# Patient Record
Sex: Male | Born: 1975 | Race: White | Hispanic: No | Marital: Married | Smoking: Current every day smoker
Health system: Southern US, Community
[De-identification: ages and names within clinical notes are randomized; demographics above are authoritative.]

## PROBLEM LIST (undated history)

## (undated) DIAGNOSIS — D6859 Other primary thrombophilia: Secondary | ICD-10-CM

## (undated) HISTORY — PX: IVC FILTER PLACEMENT (ARMC HX): HXRAD1551

---

## 2009-09-05 ENCOUNTER — Inpatient Hospital Stay (HOSPITAL_COMMUNITY): Admission: EM | Admit: 2009-09-05 | Discharge: 2009-09-06 | Payer: Self-pay | Admitting: Emergency Medicine

## 2009-09-05 ENCOUNTER — Ambulatory Visit: Payer: Self-pay | Admitting: Emergency Medicine

## 2009-11-06 ENCOUNTER — Encounter: Admission: RE | Admit: 2009-11-06 | Discharge: 2009-11-06 | Payer: Self-pay | Admitting: Orthopedic Surgery

## 2009-11-07 ENCOUNTER — Ambulatory Visit (HOSPITAL_BASED_OUTPATIENT_CLINIC_OR_DEPARTMENT_OTHER): Admission: RE | Admit: 2009-11-07 | Discharge: 2009-11-08 | Payer: Self-pay | Admitting: Orthopedic Surgery

## 2010-04-11 LAB — POCT HEMOGLOBIN-HEMACUE: Hemoglobin: 16.7 g/dL (ref 13.0–17.0)

## 2010-04-11 LAB — APTT: aPTT: 30 seconds (ref 24–37)

## 2010-04-12 LAB — DIFFERENTIAL
Basophils Absolute: 0 10*3/uL (ref 0.0–0.1)
Eosinophils Relative: 1 % (ref 0–5)
Lymphocytes Relative: 22 % (ref 12–46)
Lymphs Abs: 2 10*3/uL (ref 0.7–4.0)
Monocytes Absolute: 0.3 10*3/uL (ref 0.1–1.0)
Monocytes Relative: 3 % (ref 3–12)
Neutro Abs: 6.8 10*3/uL (ref 1.7–7.7)

## 2010-04-12 LAB — CBC
HCT: 40.6 % (ref 39.0–52.0)
HCT: 41.9 % (ref 39.0–52.0)
Hemoglobin: 14.1 g/dL (ref 13.0–17.0)
MCHC: 33.5 g/dL (ref 30.0–36.0)
MCV: 89.5 fL (ref 78.0–100.0)
MCV: 89.8 fL (ref 78.0–100.0)
Platelets: 210 10*3/uL (ref 150–400)
RBC: 4.68 MIL/uL (ref 4.22–5.81)
RDW: 13.7 % (ref 11.5–15.5)
RDW: 13.7 % (ref 11.5–15.5)
WBC: 9.2 10*3/uL (ref 4.0–10.5)

## 2010-04-12 LAB — POCT I-STAT 3, ART BLOOD GAS (G3+)
Acid-Base Excess: 1 mmol/L (ref 0.0–2.0)
Bicarbonate: 26.4 mEq/L — ABNORMAL HIGH (ref 20.0–24.0)
TCO2: 28 mmol/L (ref 0–100)
pO2, Arterial: 207 mmHg — ABNORMAL HIGH (ref 80.0–100.0)

## 2010-04-12 LAB — BASIC METABOLIC PANEL
Chloride: 106 mEq/L (ref 96–112)
GFR calc Af Amer: >60 mL/min (ref >60)
GFR calc non Af Amer: >60 mL/min (ref >60)
Potassium: 4.5 mEq/L (ref 3.5–5.1)
Sodium: 136 mEq/L (ref 135–145)

## 2010-04-12 LAB — COMPREHENSIVE METABOLIC PANEL
BUN: 7 mg/dL (ref 6–23)
Calcium: 8.5 mg/dL (ref 8.4–10.5)
Glucose, Bld: 130 mg/dL — ABNORMAL HIGH (ref 70–99)
Total Protein: 6.2 g/dL (ref 6.0–8.3)

## 2010-04-12 LAB — GLUCOSE, CAPILLARY
Glucose-Capillary: 112 mg/dL — ABNORMAL HIGH (ref 70–99)
Glucose-Capillary: 128 mg/dL — ABNORMAL HIGH (ref 70–99)
Glucose-Capillary: 129 mg/dL — ABNORMAL HIGH (ref 70–99)

## 2010-04-12 LAB — PROTIME-INR
INR: 1 (ref 0.00–1.49)
INR: 1.02 (ref 0.00–1.49)
Prothrombin Time: 13.4 seconds (ref 11.6–15.2)
Prothrombin Time: 13.6 seconds (ref 11.6–15.2)

## 2010-04-12 LAB — PROCALCITONIN: Procalcitonin: <0.5 ng/mL

## 2010-04-12 LAB — MRSA PCR SCREENING: MRSA by PCR: NEGATIVE

## 2011-08-28 IMAGING — CR DG CHEST 2V
2 series · 2 of 2 positions shown · non-contrast
Comparison: 09/05/2009

CLINICAL DATA: Pneumonia.

CHEST - 2 VIEW

[w chest pa]
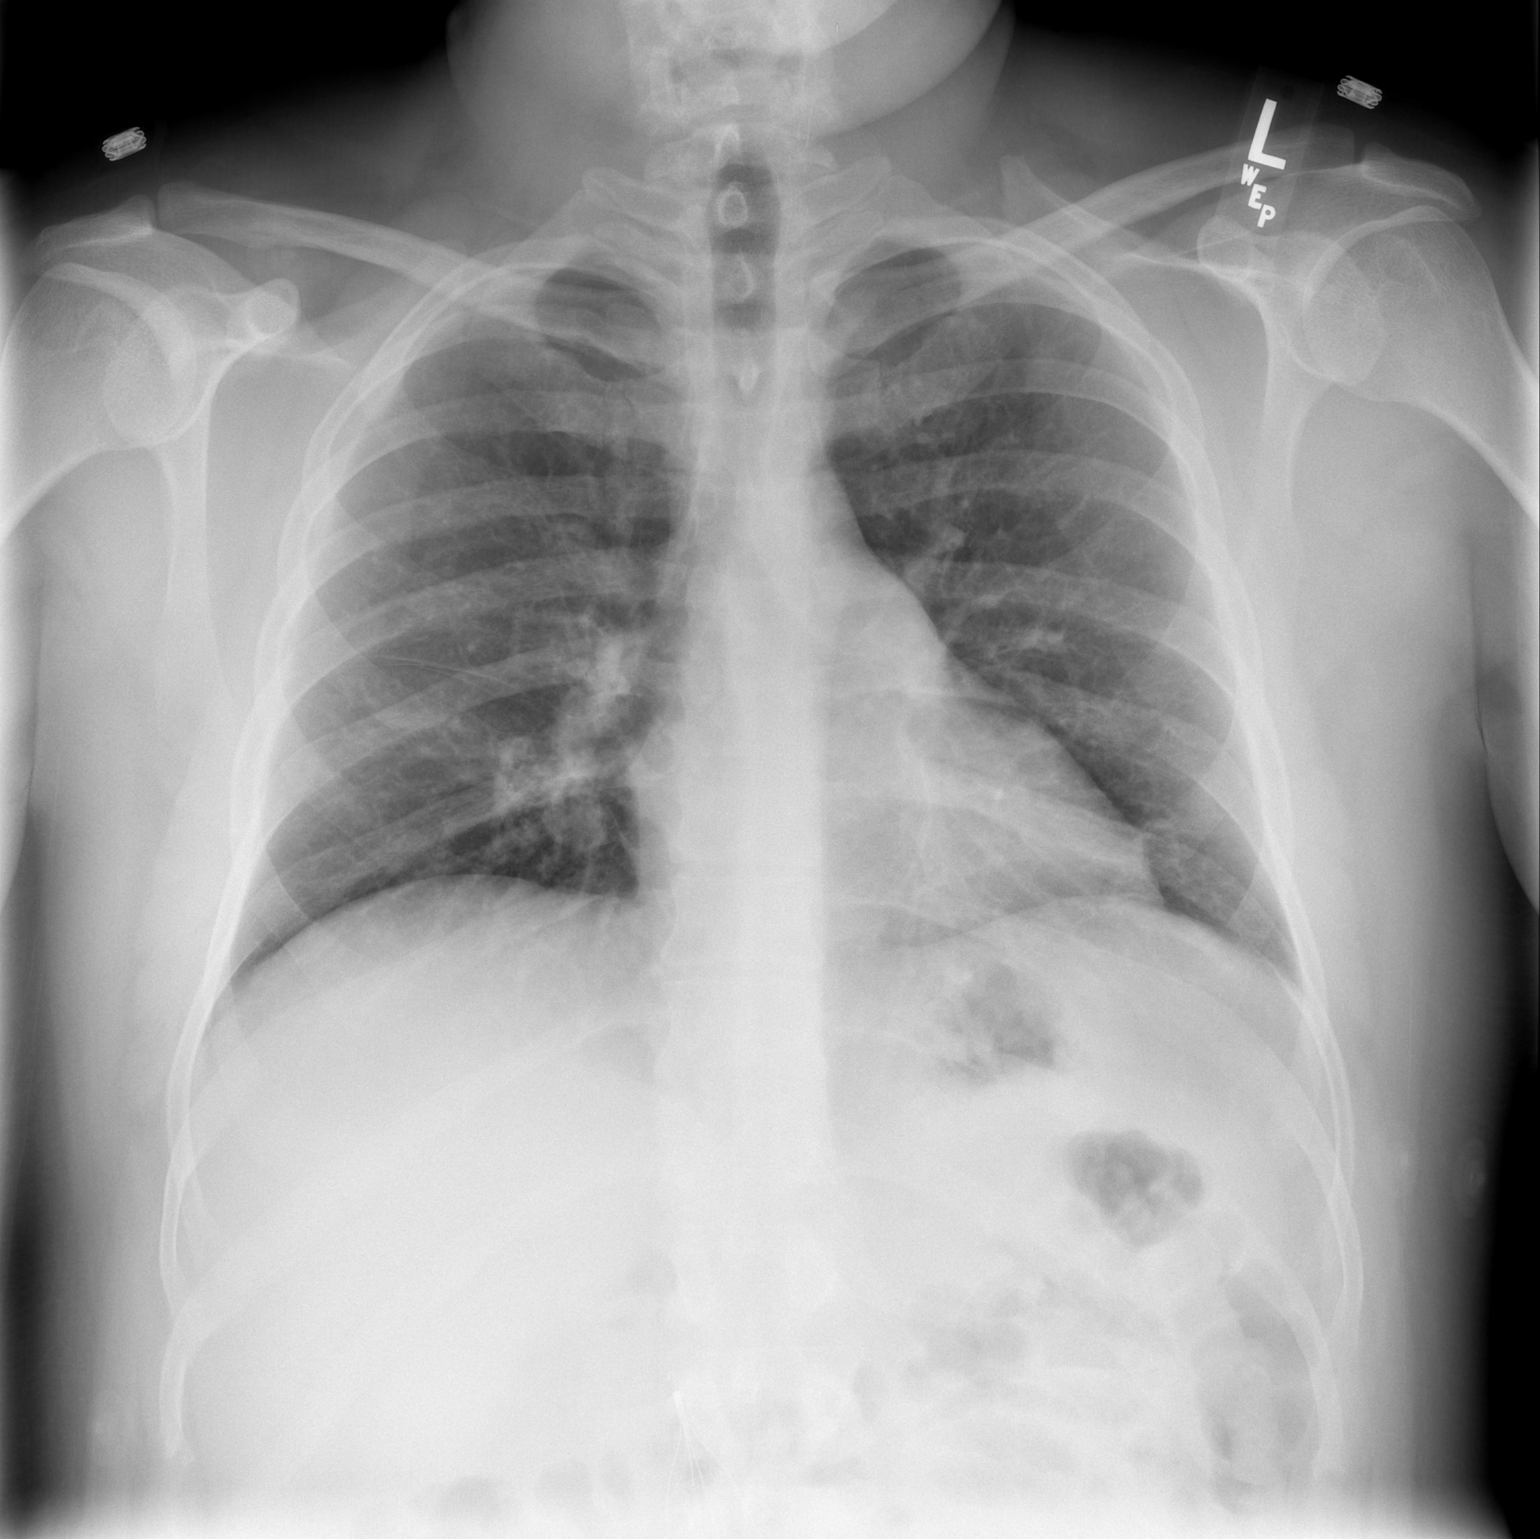

[w chest lat]
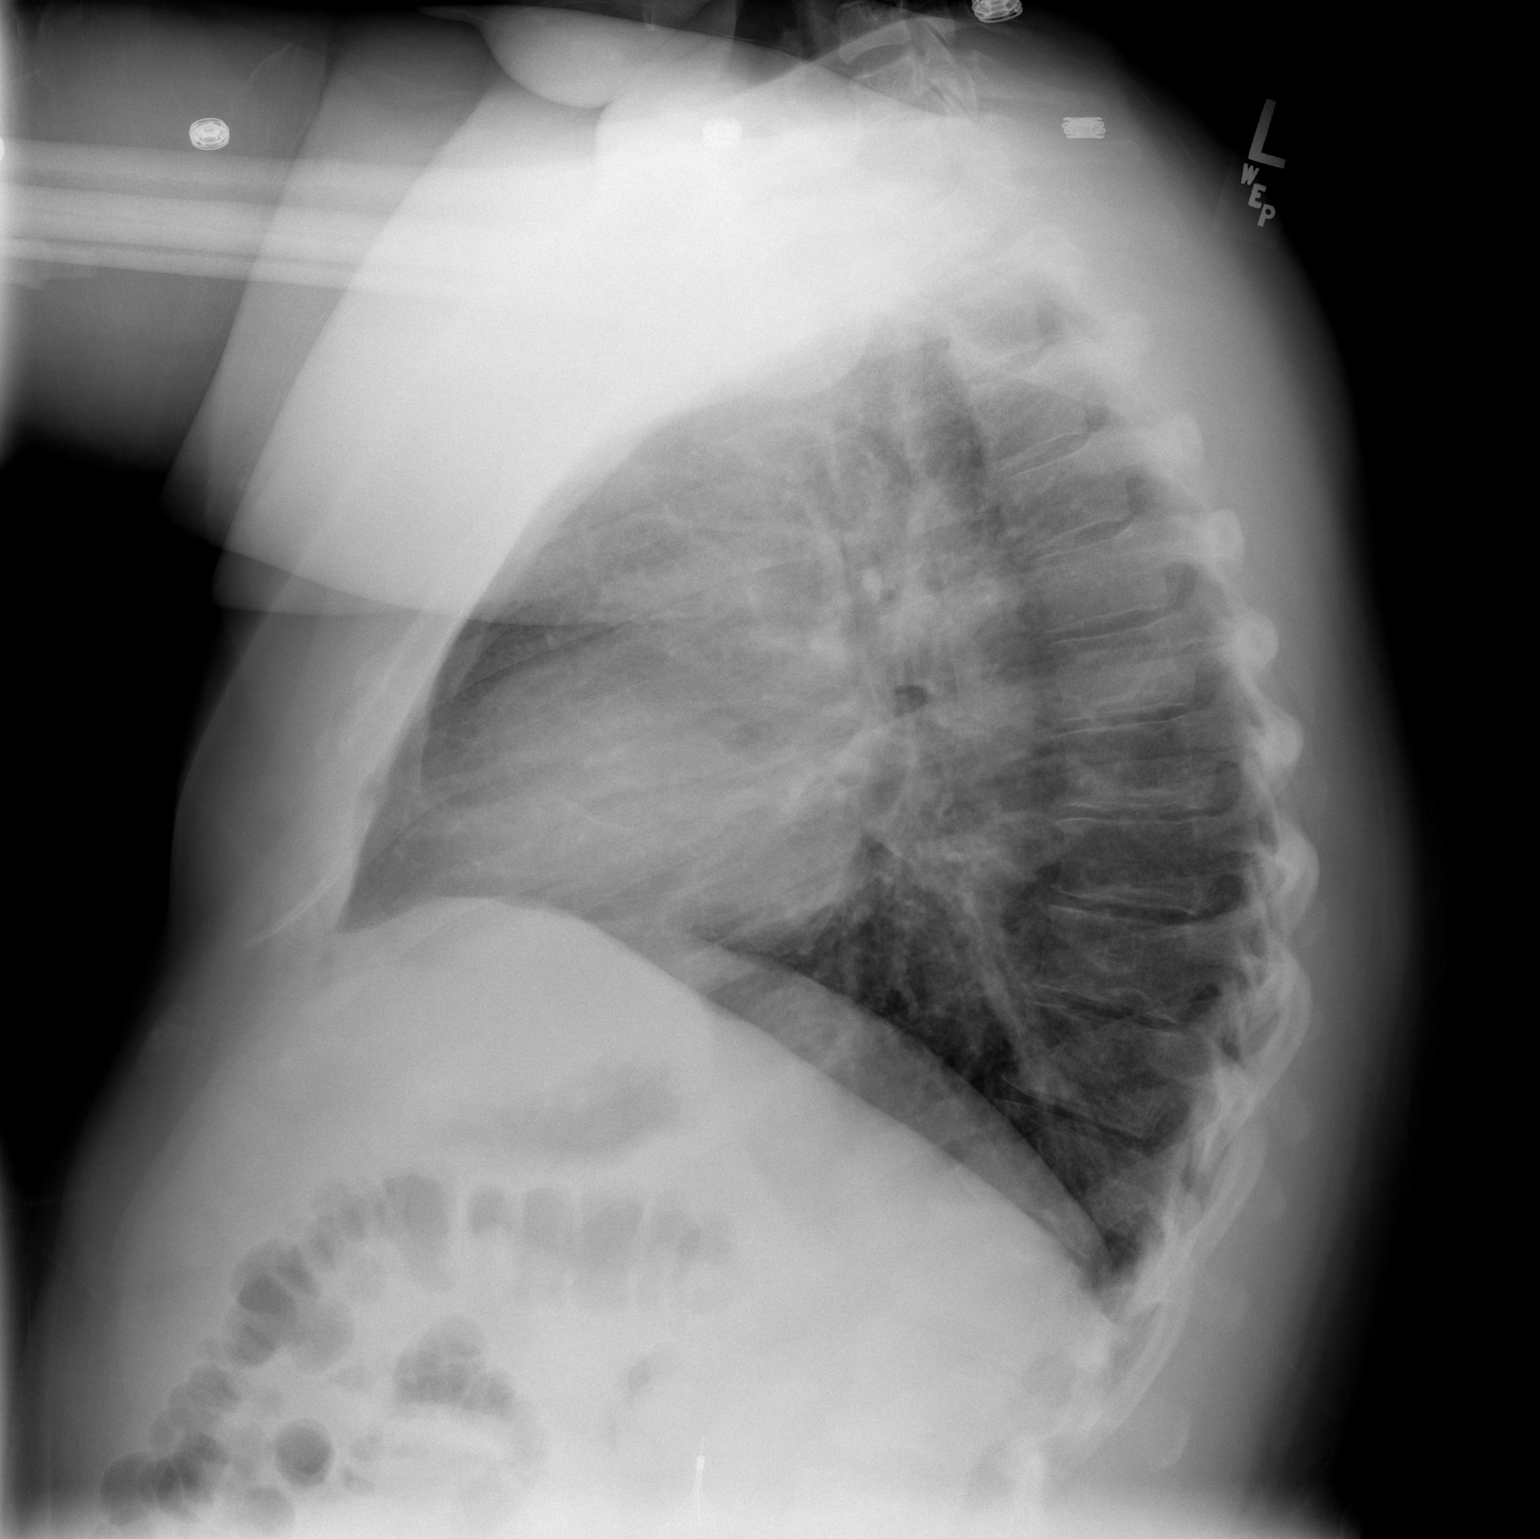

[2 of 2 positions shown; findings below may reference images not displayed]

FINDINGS: The cardiac silhouette, mediastinal and hilar contours
are within normal limits.  The endotracheal tube has been removed.
There are mild chronic bronchitic type lung changes, likely related
to smoking.  The right upper lobe process has largely resolved.
Minimal streaky areas of bibasilar atelectasis.
IMPRESSION: 1.  Resolution of right upper lobe process/atelectasis.
2.  Mild underlying bronchitic type changes may be related to
smoking.
3.  Streaky bibasilar atelectasis.

## 2011-10-28 IMAGING — CR DG CHEST 2V
2 series · 2 of 2 positions shown · non-contrast
Comparison: 09/06/2009

CLINICAL DATA: Preop respiratory exam.

CHEST - 2 VIEW

[w chest pa]
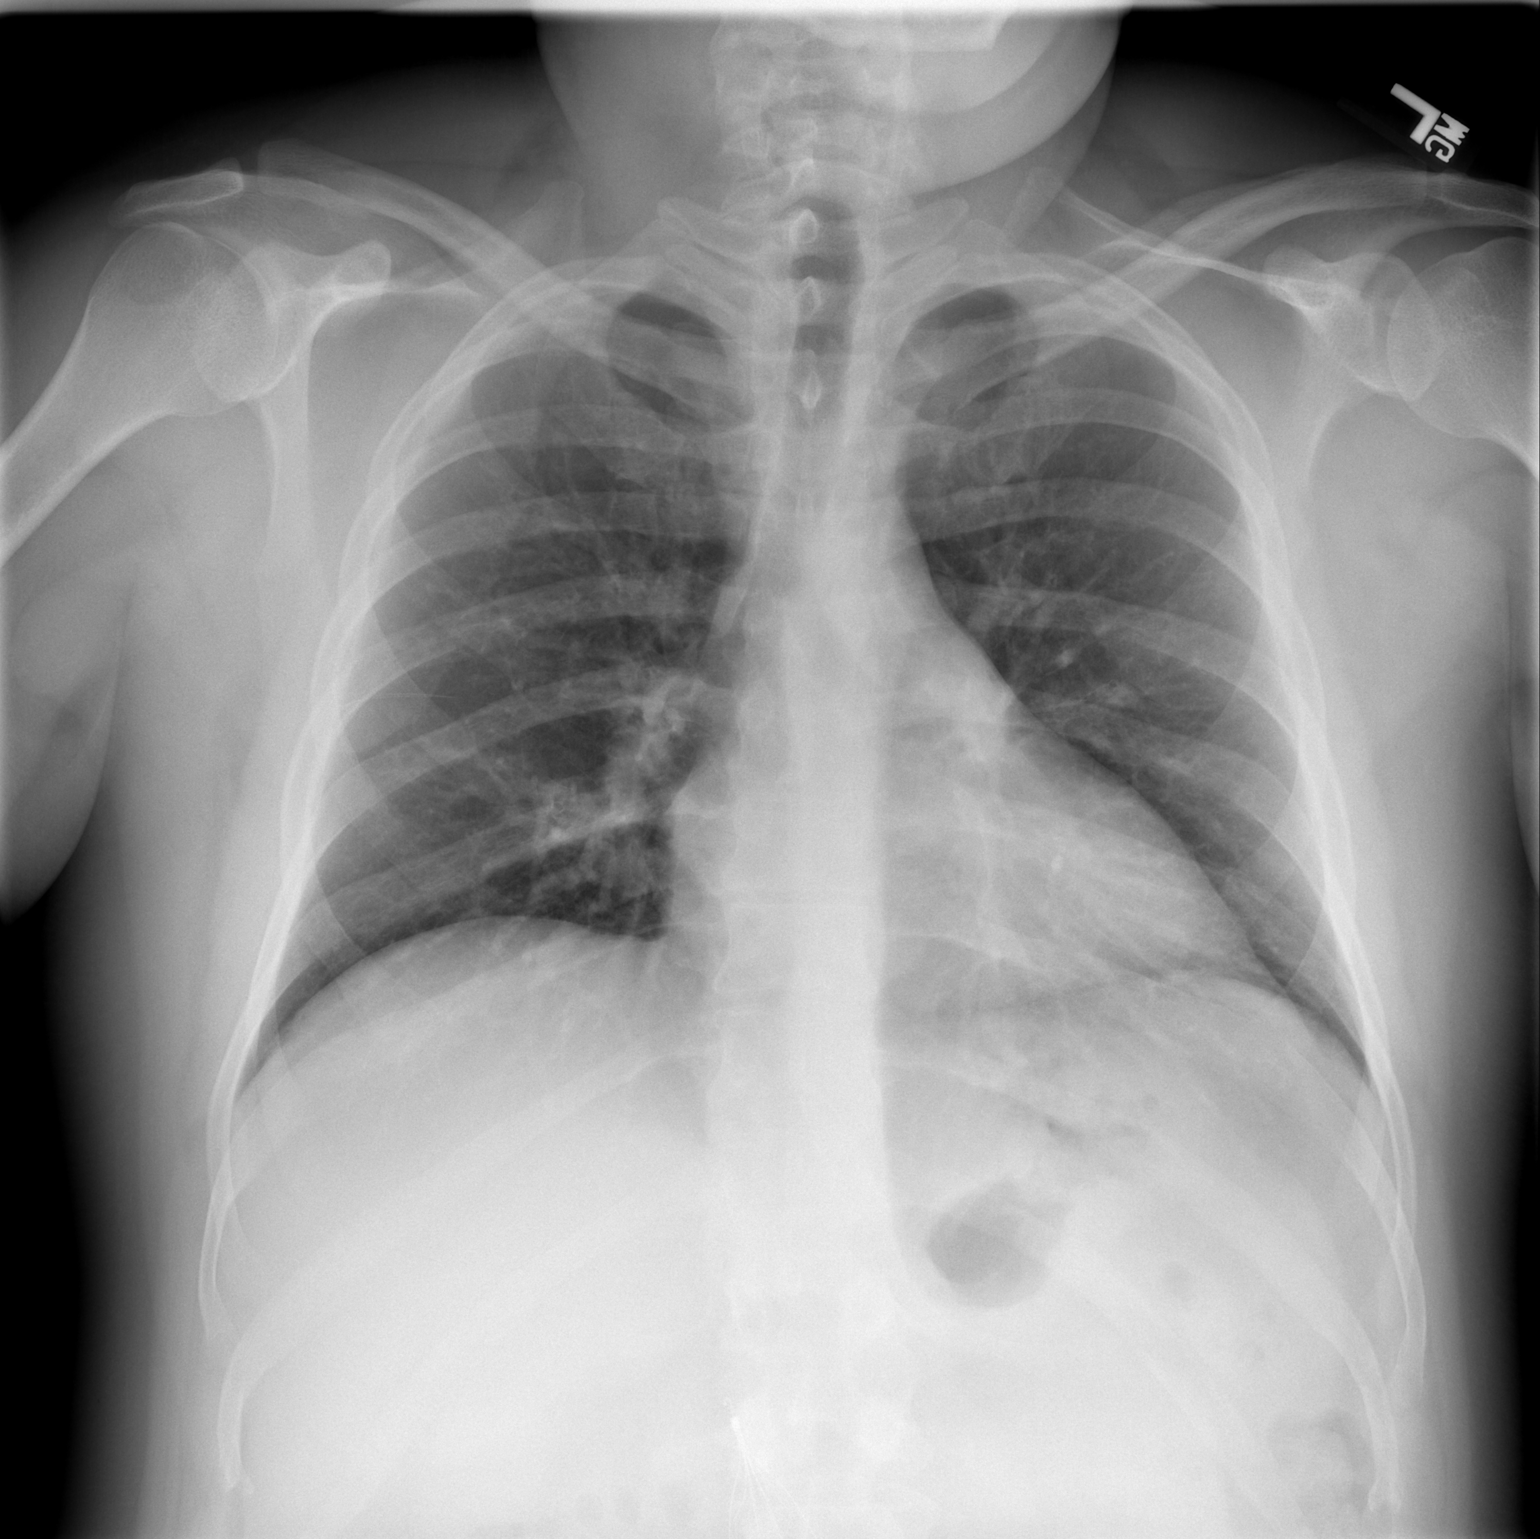

[w chest lat]
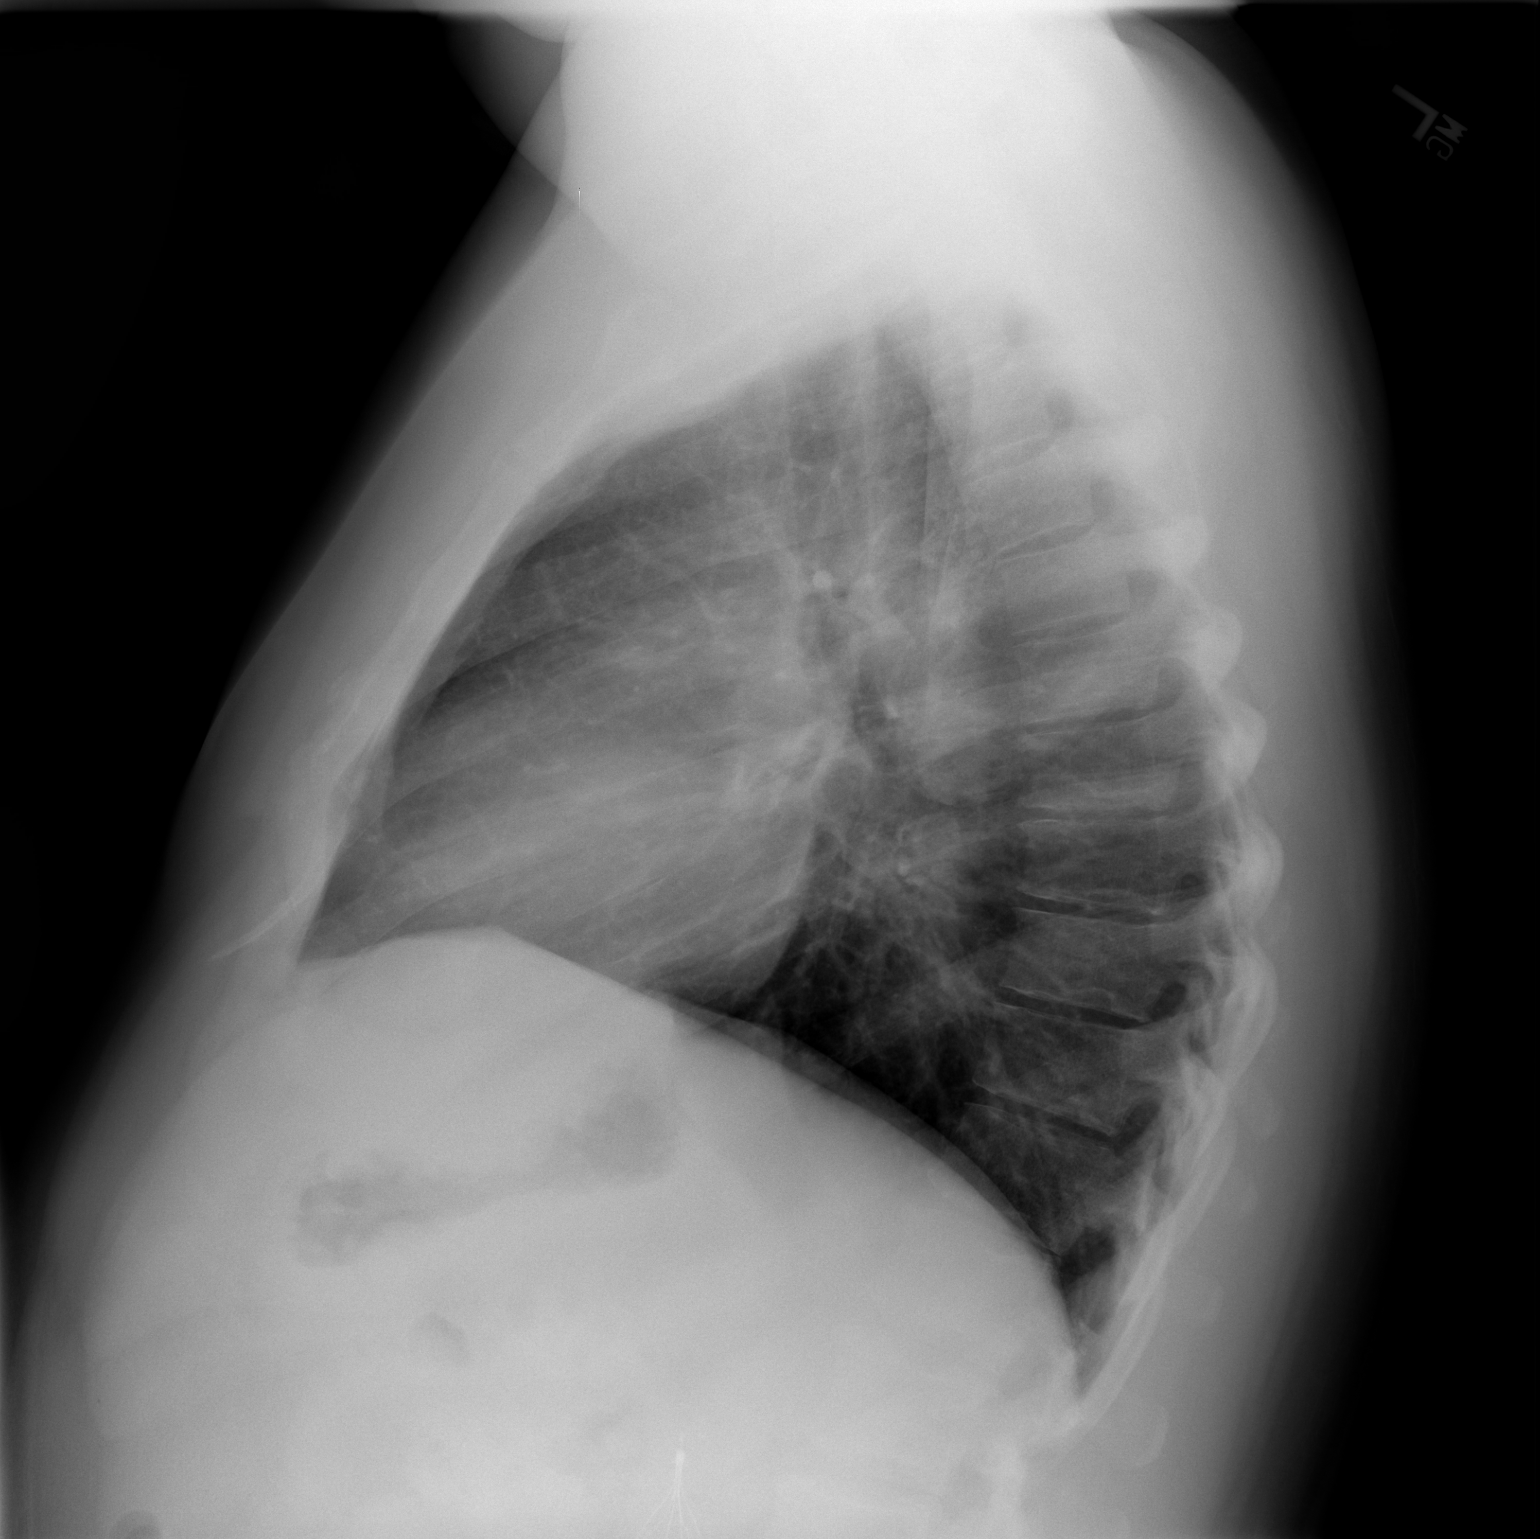

[2 of 2 positions shown; findings below may reference images not displayed]

FINDINGS: Heart size is upper normal.  Vascularity is normal.
Negative for infiltrate or effusion.  Prominent markings in the
right lung base are stable from the  prior study.  This may be due
to atelectasis or scarring.
IMPRESSION: No acute cardiopulmonary disease.

## 2013-06-10 ENCOUNTER — Emergency Department: Payer: Self-pay | Admitting: Emergency Medicine

## 2013-06-10 LAB — COMPREHENSIVE METABOLIC PANEL
ALBUMIN: 4.5 g/dL (ref 3.4–5.0)
ALK PHOS: 60 U/L
AST: 30 U/L (ref 15–37)
Anion Gap: 8 (ref 7–16)
BUN: 14 mg/dL (ref 7–18)
Bilirubin,Total: 0.6 mg/dL (ref 0.2–1.0)
CALCIUM: 9.2 mg/dL (ref 8.5–10.1)
CO2: 25 mmol/L (ref 21–32)
Chloride: 104 mmol/L (ref 98–107)
Creatinine: 1.17 mg/dL (ref 0.60–1.30)
EGFR (Non-African Amer.): >60
Glucose: 124 mg/dL — ABNORMAL HIGH (ref 65–99)
Osmolality: 276 (ref 275–301)
Potassium: 4.1 mmol/L (ref 3.5–5.1)
SGPT (ALT): 23 U/L (ref 12–78)
Sodium: 137 mmol/L (ref 136–145)
TOTAL PROTEIN: 8 g/dL (ref 6.4–8.2)

## 2013-06-10 LAB — CBC WITH DIFFERENTIAL/PLATELET
BASOS ABS: 0.1 10*3/uL (ref 0.0–0.1)
BASOS PCT: 0.4 %
Eosinophil #: 0.1 10*3/uL (ref 0.0–0.7)
Eosinophil %: 0.6 %
HCT: 47 % (ref 40.0–52.0)
HGB: 15.4 g/dL (ref 13.0–18.0)
Lymphocyte #: 2.5 10*3/uL (ref 1.0–3.6)
Lymphocyte %: 20.7 %
MCH: 29.9 pg (ref 26.0–34.0)
MCHC: 32.8 g/dL (ref 32.0–36.0)
MCV: 91 fL (ref 80–100)
MONO ABS: 0.8 x10 3/mm (ref 0.2–1.0)
Monocyte %: 6.6 %
NEUTROS ABS: 8.7 10*3/uL — AB (ref 1.4–6.5)
Neutrophil %: 71.7 %
Platelet: 307 10*3/uL (ref 150–440)
RBC: 5.15 10*6/uL (ref 4.40–5.90)
RDW: 14.5 % (ref 11.5–14.5)
WBC: 12.1 10*3/uL — AB (ref 3.8–10.6)

## 2013-06-10 LAB — URINALYSIS, COMPLETE
BLOOD: NEGATIVE
Bacteria: NONE SEEN
Bilirubin,UR: NEGATIVE
GLUCOSE, UR: NEGATIVE mg/dL (ref 0–75)
Ketone: NEGATIVE
Leukocyte Esterase: NEGATIVE
NITRITE: NEGATIVE
PH: 7 (ref 4.5–8.0)
Protein: NEGATIVE
RBC,UR: NONE SEEN /HPF (ref 0–5)
Specific Gravity: 1.025 (ref 1.003–1.030)
WBC UR: 1 /HPF (ref 0–5)

## 2013-06-10 LAB — PROTIME-INR
INR: 1.6
Prothrombin Time: 18.6 secs — ABNORMAL HIGH (ref 11.5–14.7)

## 2014-05-20 NOTE — Consult Note (Signed)
Brief Consult Note: Patient was seen by consultant.   Consult note dictated.   Recommend to proceed with surgery or procedure.   Discussed with Attending MD.   Comments: Would benefit from laparascopic repair BIH,, No sign of acute incarceration. F/U in office.  Electronic Signatures: Lattie Hawooper, Tijuana Scheidegger E (MD)  (Signed 15-May-15 20:16)  Authored: Brief Consult Note   Last Updated: 15-May-15 20:16 by Lattie Hawooper, Kwan Shellhammer E (MD)

## 2014-05-20 NOTE — Consult Note (Signed)
PATIENT NAME:  Daryl Weiss, Daryl Weiss MR#:  [id number] DATE OF BIRTH:  [birth year]  DATE OF CONSULTATION:  06/10/2013  REFERRING PHYSICIAN:   CONSULTING PHYSICIAN:  Adah Salvageichard E. Excell Seltzerooper, MD  CHIEF COMPLAINT: Left groin pain.   HISTORY OF PRESENT ILLNESS: This is a patient with long-standing bilateral hernias which have always been reducible. They have caused him intermittent and very frequent pain. In fact, [place] "[place]." Today, he experienced acute left groin pain after lifting heavy objects at work. He Warden/rangerinstalls cabinetry. His pain is much better now, and both of his hernias are reducible. He has had very minimal right groin pain. Almost all of it is on the left. He denies nausea, vomiting, fevers or chills.   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   PAST SURGICAL HISTORY: Orthopedic surgery both in the ankle and hand. No abdominal surgery. He does have a [place] filter in place, placed via the IJ.   ALLERGIES: None.   MEDICATIONS: Coumadin.   FAMILY HISTORY: Noncontributory.   SOCIAL HISTORY: The patient works Field seismologistinstalling cabinetry.   REVIEW OF SYSTEMS: A 10 system review is performed and negative with the exception of that mentioned in the HPI.    PHYSICAL EXAMINATION:  GENERAL: Healthy-appearing male patient. He appears somewhat uncomfortable and agitated.  HEENT: No scleral icterus. Multiple piercings are present.  CHEST: Clear to auscultation.  CARDIAC: Regular rate and rhythm.  ABDOMEN: Soft and nontender, nondistended, nontympanitic.  GENITOURINARY: Demonstrates a huge left groin hernia and a smaller reducible right inguinal hernia. The left scrotal hernia is partially reducible, nonerythematous and essentially nontender but is only partially reducible. EXTREMITIES: Without edema. NEUROLOGIC: Grossly intact. INTEGUMENT: No jaundice.   LABORATORIES: INR is  1.4. White blood cell count of 12, H and H of 15 and 47 and a platelet count of 307. Electrolytes are within normal limits.   ASSESSMENT AND PLAN: This is a patient with bilateral inguinal hernias, long-standing and neglected left scrotal hernia. I have recommended that the patient would benefit from an elective laparoscopic preperitoneal repair of bilateral inguinal hernias. He is not experiencing any further signs of acute incarceration and, in fact, his left inguinal hernia has always been reducible, just more tender today than usual without signs of incarceration. My recommendations are as above. He will follow up in the office this week. This was discussed with Dr. Mayford KnifeWilliams who is in agreement with the plan.    ____________________________ Adah Salvageichard E. Excell Seltzerooper, MD rec:gb D: 06/10/2013 20:20:47 ET T: 06/10/2013 21:21:18 ET JOB#: [id number]  cc: Adah Salvageichard E. Excell Seltzerooper, MD, <Dictator> Lattie HawICHARD E Lamondre Wesche MD ELECTRONICALLY SIGNED 06/10/2013 22:07

## 2015-01-16 ENCOUNTER — Emergency Department (HOSPITAL_BASED_OUTPATIENT_CLINIC_OR_DEPARTMENT_OTHER): Payer: Self-pay

## 2015-01-16 ENCOUNTER — Emergency Department (HOSPITAL_BASED_OUTPATIENT_CLINIC_OR_DEPARTMENT_OTHER)
Admission: EM | Admit: 2015-01-16 | Discharge: 2015-01-17 | Disposition: A | Discharge status: Home / Self Care | Payer: Self-pay | Attending: Emergency Medicine | Admitting: Emergency Medicine

## 2015-01-16 ENCOUNTER — Encounter (HOSPITAL_BASED_OUTPATIENT_CLINIC_OR_DEPARTMENT_OTHER): Payer: Self-pay | Admitting: *Deleted

## 2015-01-16 DIAGNOSIS — Z8639 Personal history of other endocrine, nutritional and metabolic disease: Secondary | ICD-10-CM | POA: Insufficient documentation

## 2015-01-16 DIAGNOSIS — Z7901 Long term (current) use of anticoagulants: Secondary | ICD-10-CM | POA: Insufficient documentation

## 2015-01-16 DIAGNOSIS — I82402 Acute embolism and thrombosis of unspecified deep veins of left lower extremity: Secondary | ICD-10-CM | POA: Insufficient documentation

## 2015-01-16 DIAGNOSIS — F17210 Nicotine dependence, cigarettes, uncomplicated: Secondary | ICD-10-CM | POA: Insufficient documentation

## 2015-01-16 HISTORY — DX: Other primary thrombophilia: D68.59

## 2015-01-16 NOTE — ED Provider Notes (Signed)
CSN: [id number]     Arrival date & time 01/16/15  2223 History  By signing my name below, I, Daryl Weiss, attest that this documentation has been prepared under the direction and in the presence of Paula Libra, MD. Electronically Signed: Bethel Weiss, ED Scribe. 01/16/2015. 11:50 PM  Chief Complaint  Patient presents with  . Leg Pain   The history is provided by the patient. No language interpreter was used.   Xylan Sheils is a 39 y.o. male with history of protein C deficiency and IVC filter placement who presents to the Emergency Department complaining of constant, 7/10 in severity, left lower leg pain with sudden onset this afternoon at approximately 4 PM. He describes the pain as similar to the pain that he had with previous clots. Pt states that the pain has improved with rest.  The pt is normally on coumadin but has been out for 1 week. He denies chest pain and SOB. He has no history of asthma. Pt is a smoker.   Past Medical History  Diagnosis Date  . Protein C deficiency [facility])    Past Surgical History  Procedure Laterality Date  . Ivc filter placement ([facility] hx)     No family history on file. Social History  Substance Use Topics  . Smoking status: Current Every Day Smoker -- 1.00 packs/day    Types: Cigarettes  . Smokeless tobacco: None  . Alcohol Use: No    Review of Systems 10 Systems reviewed and all are negative for acute change except as noted in the HPI. Allergies  Review of patient's allergies indicates no known allergies.  Home Medications   Prior to Admission medications   Medication Sig Start Date End Date Taking? Authorizing Provider  Warfarin Sodium (COUMADIN PO) Take by mouth.   Yes Historical Provider, MD   BP 166/88 mmHg  Pulse 107  Temp(Src) 98.1 F (36.7 C) (Oral)  Resp 20  Ht  (1.778 m)  Wt 180 lb (81.647 kg)  BMI 25.83 kg/m2  SpO2 100% Physical Exam General: Well-developed, well-nourished male in no acute distress; appearance  consistent with age of record HENT: normocephalic; atraumatic Eyes: pupils equal, round and reactive to light; extraocular muscles intact Neck: supple Heart: regular rate and rhythm; no murmurs, rubs or gallops Lungs: Inspiratory wheezing in the right upper lobe. Abdomen: soft; nondistended; nontender; no masses or hepatosplenomegaly; bowel sounds present Extremities: No deformity; full range of motion; pulses normal. Tenderness, edema, and mild erythema of the left calf.  Neurologic: Awake, alert and oriented; motor function intact in all extremities and symmetric; no facial droop Skin: Warm and dry Psychiatric: Normal mood and affect  ED Course  Procedures (including critical care time) DIAGNOSTIC STUDIES: Oxygen Saturation is 100% on RA,  normal by my interpretation.    COORDINATION OF CARE: 11:48 PM Discussed treatment plan which includes LLE [place] with pt at bedside and pt agreed to plan.    MDM  Nursing notes and vitals signs, including pulse oximetry, reviewed.  Summary of this visit's results, reviewed by myself:  Imaging Studies: US Venous Img Lower Unilateral Left  01/16/2015  CLINICAL DATA:  Protein C deficiency and history of DVT with pain and redness in the left lower extremity. EXAM: Left LOWER EXTREMITY VENOUS DOPPLER ULTRASOUND TECHNIQUE: Gray-scale sonography with graded compression, as well as color Doppler and duplex ultrasound were performed to evaluate the lower extremity deep venous systems from the level of the common femoral vein and including the common femoral, femoral, profunda  femoral, popliteal and calf veins including the posterior tibial, peroneal and gastrocnemius veins when visible. The superficial great saphenous vein was also interrogated. Spectral Doppler was utilized to evaluate flow at rest and with distal augmentation maneuvers in the common femoral, femoral and popliteal veins. COMPARISON:  None. FINDINGS: Contralateral Common Femoral Vein:  Respiratory phasicity is normal and symmetric with the symptomatic side. No evidence of thrombus. Normal compressibility. Common Femoral Vein: No evidence of thrombus. Saphenofemoral Junction: No evidence of thrombus. Profunda Femoral Vein: No evidence of thrombus. Femoral Vein: No evidence of thrombus. Popliteal Vein: No evidence of thrombus. Calf Veins: The posterior tibial vein is noncompressible with internal echoes and absent color Doppler flow from the mid calf to the ankle consistent with recent thrombosis. There is also a perforator vein between the posterior tibial vein and great saphenous system, with varicosity, that is diffusely thrombosed. Peroneal vein not visualized. IMPRESSION: Positive for diffuse posterior tibial vein thrombosis extending into greater saphenous varicosities at the calf and ankle. Electronically Signed   By: Marnee SpringJonathon  Watts M.D.   On: 01/16/2015 23:52   12:48 AM Patient advised of ultrasound findings. We will start him on Lovenox and Coumadin. He has been [facility] but has been noncompliant because his Medicaid lapsed. 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    Final diagnoses:  DVT (deep venous thrombosis), left   I personally performed the services described in this documentation, which was scribed in my presence. The recorded information has been reviewed and is accurate.    Paula LibraJohn Aidric Endicott, MD 01/17/15 [phone]

## 2015-01-16 NOTE — ED Notes (Addendum)
Left lower leg pain since this afternoon. States he thinks he has a blood clot. Hx of blood clots. He ran out of Coumadin a week ago.

## 2015-01-16 NOTE — ED Notes (Addendum)
To US via stretcher, alert, NAD, calm, interactive, no dyspnea noted, (denies: fever, nvd, sob or CP), states, "haven't been taking coumadin or ASA". No meds PTA. 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

## 2015-01-16 NOTE — ED Notes (Signed)
Pt in US

## 2015-01-16 NOTE — ED Notes (Signed)
Back from US.

## 2015-01-17 MED ORDER — FENTANYL CITRATE (PF) 100 MCG/2ML IJ SOLN
100.0000 ug | Freq: Once | INTRAMUSCULAR | Status: AC
Start: 2015-01-17 — End: 2015-01-17
  Administered 2015-01-17: 100 ug via INTRAVENOUS
  Filled 2015-01-17: qty 2

## 2015-01-17 MED ORDER — WARFARIN SODIUM 2.5 MG PO TABS: ORAL_TABLET | ORAL | Status: AC

## 2015-01-17 MED ORDER — ENOXAPARIN SODIUM 100 MG/ML ~~LOC~~ SOLN: 1.0000 mg/kg | Freq: Two times a day (BID) | SUBCUTANEOUS | Status: AC

## 2015-01-17 MED ORDER — HYDROCODONE-ACETAMINOPHEN 5-325 MG PO TABS: 1.0000 | ORAL_TABLET | Freq: Four times a day (QID) | ORAL | Status: AC | PRN

## 2015-01-17 MED ORDER — WARFARIN SODIUM 5 MG PO TABS
7.5000 mg | ORAL_TABLET | Freq: Once | ORAL | Status: AC
  Administered 2015-01-17: 7.5 mg via ORAL
  Filled 2015-01-17: qty 2

## 2015-01-17 MED ORDER — ENOXAPARIN SODIUM 80 MG/0.8ML ~~LOC~~ SOLN
1.0000 mg/kg | Freq: Once | SUBCUTANEOUS | Status: AC
  Administered 2015-01-17: 80 mg via SUBCUTANEOUS
  Filled 2015-01-17: qty 0.8

## 2015-01-17 NOTE — Discharge Instructions (Signed)
Deep Vein Thrombosis °A deep vein thrombosis (DVT) is a blood clot (thrombus) that usually occurs in a deep, larger vein of the lower leg or the pelvis, or in an upper extremity such as the arm. These are dangerous and can lead to serious and even life-threatening complications if the clot travels to the lungs. °A DVT can damage the valves in your leg veins so that instead of flowing upward, the blood pools in the lower leg. This is called post-thrombotic syndrome, and it can result in pain, swelling, discoloration, and sores on the leg. °CAUSES °A DVT is caused by the formation of a blood clot in your leg, pelvis, or arm. Usually, several things contribute to the formation of blood clots. A clot may develop when: °· Your blood flow slows down. °· Your vein becomes damaged in some way. °· You have a condition that makes your blood clot more easily. °RISK FACTORS °A DVT is more likely to develop in: °· People who are older, especially over 60 years of age. °· People who are overweight (obese). °· People who sit or lie still for a long time, such as during long-distance travel (over 4 hours), bed rest, hospitalization, or during recovery from certain medical conditions like a stroke. °· People who do not engage in much physical activity (sedentary lifestyle). °· People who have chronic breathing disorders. °· People who have a personal or family history of blood clots or blood clotting disease. °· People who have peripheral vascular disease (PVD), diabetes, or some types of cancer. °· People who have heart disease, especially if the person had a recent heart attack or has congestive heart failure. °· People who have neurological diseases that affect the legs (leg paresis). °· People who have had a traumatic injury, such as breaking a hip or leg. °· People who have recently had major or lengthy surgery, especially on the hip, knee, or abdomen. °· People who have had a central line placed inside a large vein. °· People  who take medicines that contain the hormone estrogen. These include birth control pills and hormone replacement therapy. °· Pregnancy or during childbirth or the postpartum period. °· Long plane flights (over 8 hours). °SIGNS AND SYMPTOMS °Symptoms of a DVT can include:  °· Swelling of your leg or arm, especially if one side is much worse. °· Warmth and redness of your leg or arm, especially if one side is much worse. °· Pain in your arm or leg. If the clot is in your leg, symptoms may be more noticeable or worse when you stand or walk. °· A feeling of pins and needles, if the clot is in the arm. °The symptoms of a DVT that has traveled to the lungs (pulmonary embolism, PE) usually start suddenly and include: °· Shortness of breath while active or at rest. °· Coughing or coughing up blood or blood-tinged mucus. °· Chest pain that is often worse with deep breaths. °· Rapid or irregular heartbeat. °· Feeling light-headed or dizzy. °· Fainting. °· Feeling anxious. °· Sweating. °There may also be pain and swelling in a leg if that is where the blood clot started. °These symptoms may represent a serious problem that is an emergency. Do not wait to see if the symptoms will go away. Get medical help right away. Call your local emergency services (911 in the [place] Do not drive yourself to the hospital. °DIAGNOSIS °Your health care provider will take a medical history and perform a physical exam. You may also   have other tests, including: °· Blood tests to assess the clotting properties of your blood. °· Imaging tests, such as CT, ultrasound, MRI, X-ray, and other tests to see if you have clots anywhere in your body. °TREATMENT °After a DVT is identified, it can be treated. The type of treatment that you receive depends on many factors, such as the cause of your DVT, your risk for bleeding or developing more clots, and other medical conditions that you have. Sometimes, a combination of treatments is necessary. Treatment  options may be combined and include: °· Monitoring the blood clot with ultrasound. °· Taking medicines by mouth, such as newer blood thinners (anticoagulants), thrombolytics, or warfarin. °· Taking anticoagulant medicine by injection or through an IV tube. °· Wearing compression stockings or using different types of devices. °· Surgery (rare) to remove the blood clot or to place a filter in your abdomen to stop the blood clot from traveling to your lungs. °Treatments for a DVT are often divided into immediate treatment and long-term treatment (up to 3 months after DVT). You can work with your health care provider to choose the treatment program that is best for you. °HOME CARE INSTRUCTIONS °If you are taking a newer oral anticoagulant: °· Take the medicine every single day at the same time each day. °· Understand what foods and drugs interact with this medicine. °· Understand that there are no regular blood tests required when using this medicine. °· Understand the side effects of this medicine, including excessive bruising or bleeding. Ask your health care provider or pharmacist about other possible side effects. °If you are taking warfarin: °· Understand how to take warfarin and know which foods can affect how warfarin works in your body. °· Understand that it is dangerous to take too much or too little warfarin. Too much warfarin increases the risk of bleeding. Too little warfarin continues to allow the risk for blood clots. °· Follow your PT and INR blood testing schedule. The PT and INR results allow your health care provider to adjust your dose of warfarin. It is very important that you have your PT and INR tested as often as told by your health care provider. °· Avoid major changes in your diet, or tell your health care provider before you change your diet. Arrange a visit with a registered dietitian to answer your questions. Many foods, especially foods that are high in vitamin K, can interfere with warfarin  and affect the PT and INR results. Eat a consistent amount of foods that are high in vitamin K, such as: °¨ Spinach, kale, broccoli, cabbage, collard greens, turnip greens, Brussels sprouts, peas, cauliflower, seaweed, and parsley. °¨ Beef liver and pork liver. °¨ Green tea. °¨ Soybean oil. °· Tell your health care provider about any and all medicines, vitamins, and supplements that you take, including aspirin and other over-the-counter anti-inflammatory medicines. Be especially cautious with aspirin and anti-inflammatory medicines. Do not take those before you ask your health care provider if it is safe to do so. This is important because many medicines can interfere with warfarin and affect the PT and INR results. °· Do not start or stop taking any over-the-counter or prescription medicine unless your health care provider or pharmacist tells you to do so. °If you take warfarin, you will also need to do these things: °· Hold pressure over cuts for longer than usual. °· Tell your dentist and other health care providers that you are taking warfarin before you have any procedures in which   bleeding may occur. °· Avoid alcohol or drink very small amounts. Tell your health care provider if you change your alcohol intake. °· Do not use tobacco products, including cigarettes, chewing tobacco, and e-cigarettes. If you need help quitting, ask your health care provider. °· Avoid contact sports. °General Instructions °· Take over-the-counter and prescription medicines only as told by your health care provider. Anticoagulant medicines can have side effects, including easy bruising and difficulty stopping bleeding. If you are prescribed an anticoagulant, you will also need to do these things: °¨ Hold pressure over cuts for longer than usual. °¨ Tell your dentist and other health care providers that you are taking anticoagulants before you have any procedures in which bleeding may occur. °¨ Avoid contact sports. °· Wear a medical  alert bracelet or carry a medical alert card that says you have had a PE. °· Ask your health care provider how soon you can go back to your normal activities. Stay active to prevent new blood clots from forming. °· Make sure to exercise while traveling or when you have been sitting or standing for a long period of time. It is very important to exercise. Exercise your legs by walking or by tightening and relaxing your leg muscles often. Take frequent walks. °· Wear compression stockings as told by your health care provider to help prevent more blood clots from forming. °· Do not use tobacco products, including cigarettes, chewing tobacco, and e-cigarettes. If you need help quitting, ask your health care provider. °· Keep all follow-up appointments with your health care provider. This is important. °PREVENTION °Take these actions to decrease your risk of developing another DVT: °· Exercise regularly. For at least 30 minutes every day, engage in: °¨ Activity that involves moving your arms and legs. °¨ Activity that encourages good blood flow through your body by increasing your heart rate. °· Exercise your arms and legs every hour during long-distance travel (over 4 hours). Drink plenty of water and avoid drinking alcohol while traveling. °· Avoid sitting or lying in bed for long periods of time without moving your legs. °· Maintain a weight that is appropriate for your height. Ask your health care provider what weight is healthy for you. °· If you are a woman who is over 35 years of age, avoid unnecessary use of medicines that contain estrogen. These include birth control pills. °· Do not smoke, especially if you take estrogen medicines. If you need help quitting, ask your health care provider. °If you are hospitalized, prevention measures may include: °· Early walking after surgery, as soon as your health care provider says that it is safe. °· Receiving anticoagulants to prevent blood clots. If you cannot take  anticoagulants, other options may be available, such as wearing compression stockings or using different types of devices. °SEEK IMMEDIATE MEDICAL CARE IF: °· You have new or increased pain, swelling, or redness in an arm or leg. °· You have numbness or tingling in an arm or leg. °· You have shortness of breath while active or at rest. °· You have chest pain. °· You have a rapid or irregular heartbeat. °· You feel light-headed or dizzy. °· You cough up blood. °· You notice blood in your vomit, bowel movement, or urine. °These symptoms may represent a serious problem that is an emergency. Do not wait to see if the symptoms will go away. Get medical help right away. Call your local emergency services (911 in the [place] Do not drive yourself to the hospital. °  °  This information is not intended to replace advice given to you by your health care provider. Make sure you discuss any questions you have with your health care provider. °  °Document Released: 01/13/2005 Document Revised: 10/04/2014 Document Reviewed: 05/10/2014 °[employer] Interactive Patient Education ©2016 [employer] ° °

## 2015-03-28 DEATH — deceased
